# Patient Record
Sex: Male | Born: 2005 | Race: Black or African American | Hispanic: No | Marital: Single | Smoking: Never smoker
Health system: Southern US, Community
[De-identification: ages and names within clinical notes are randomized; demographics above are authoritative.]

## PROBLEM LIST (undated history)

## (undated) HISTORY — PX: ADENOIDECTOMY: SUR15

## (undated) HISTORY — PX: TONSILLECTOMY: SUR1361

---
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
## 2024-01-18 ENCOUNTER — Encounter: Payer: Self-pay | Admitting: Family Medicine

## 2024-05-09 ENCOUNTER — Encounter: Payer: Self-pay | Admitting: Dermatology

## 2024-05-09 ENCOUNTER — Ambulatory Visit: Admitting: Dermatology

## 2024-05-09 DIAGNOSIS — B360 Pityriasis versicolor: Secondary | ICD-10-CM

## 2024-05-09 MED ORDER — KETOCONAZOLE 2 % EX SHAM: MEDICATED_SHAMPOO | CUTANEOUS | 11 refills | Status: AC

## 2024-05-09 NOTE — Patient Instructions (Addendum)
 Stop fluconazole tablet.   Use ketoconazole  shampoo as body wash daily in shower.    Tinea versicolor is a chronic recurrent skin rash causing discolored scaly spots most commonly seen on back, chest, and/or shoulders.  It is generally asymptomatic. The rash is due to overgrowth of a common type of yeast present on everyone's skin and it is not contagious.  It tends to flare more in the summer due to increased sweating on trunk.  After rash is treated, the scaliness will resolve, but the discoloration will take longer to return to normal pigmentation. The periodic use of an OTC medicated soap/shampoo with zinc or selenium sulfide can be helpful to prevent yeast overgrowth and recurrence.    Advised it can take many months for affected areas to repigment.    Due to recent changes in healthcare laws, you may see results of your pathology and/or laboratory studies on MyChart before the doctors have had a chance to review them. We understand that in some cases there may be results that are confusing or concerning to you. Please understand that not all results are received at the same time and often the doctors may need to interpret multiple results in order to provide you with the best plan of care or course of treatment. Therefore, we ask that you please give us  2 business days to thoroughly review all your results before contacting the office for clarification. Should we see a critical lab result, you will be contacted sooner.   If You Need Anything After Your Visit  If you have any questions or concerns for your doctor, please call our main line at [phone] and press option 4 to reach your doctor's medical assistant. If no one answers, please leave a voicemail as directed and we will return your call as soon as possible. Messages left after 4 pm will be answered the following business day.   You may also send us  a message via MyChart. We typically respond to MyChart messages within 1-2 business  days.  For prescription refills, please ask your pharmacy to contact our office. Our fax number is [phone].  If you have an urgent issue when the clinic is closed that cannot wait until the next business day, you can page your doctor at the number below.    Please note that while we do our best to be available for urgent issues outside of office hours, we are not available 24/7.   If you have an urgent issue and are unable to reach us , you may choose to seek medical care at your doctor's office, retail clinic, urgent care center, or emergency room.  If you have a medical emergency, please immediately call 911 or go to the emergency department.  Pager Numbers  - Dr. Hester: [phone]  - Dr. Jackquline: [phone]  - Dr. Claudene: [phone]   - Dr. Raymund: [phone]  In the event of inclement weather, please call our main line at [phone] for an update on the status of any delays or closures.  Dermatology Medication Tips: Please keep the boxes that topical medications come in in order to help keep track of the instructions about where and how to use these. Pharmacies typically print the medication instructions only on the boxes and not directly on the medication tubes.   If your medication is too expensive, please contact our office at [phone] option 4 or send us  a message through MyChart.   We are unable to tell what your co-pay for medications will be  in advance as this is different depending on your insurance coverage. However, we may be able to find a substitute medication at lower cost or fill out paperwork to get insurance to cover a needed medication.   If a prior authorization is required to get your medication covered by your insurance company, please allow us  1-2 business days to complete this process.  Drug prices often vary depending on where the prescription is filled and some pharmacies may offer cheaper prices.  The website www.goodrx.com contains  coupons for medications through different pharmacies. The prices here do not account for what the cost may be with help from insurance (it may be cheaper with your insurance), but the website can give you the price if you did not use any insurance.  - You can print the associated coupon and take it with your prescription to the pharmacy.  - You may also stop by our office during regular business hours and pick up a GoodRx coupon card.  - If you need your prescription sent electronically to a different pharmacy, notify our office through [facility] or by phone at [phone] option 4.     Si Usted Necesita Algo Despus de Su Visita  Tambin puede enviarnos un mensaje a travs de Clinical Cytogeneticist. Por lo general respondemos a los mensajes de MyChart en el transcurso de 1 a 2 das hbiles.  Para renovar recetas, por favor pida a su farmacia que se ponga en contacto con nuestra oficina. Randi lakes de fax es [place] [phone].  Si tiene un asunto urgente cuando la clnica est cerrada y que no puede esperar hasta el siguiente da hbil, puede llamar/localizar a su doctor(a) al nmero que aparece a continuacin.   Por favor, tenga en cuenta que aunque hacemos todo lo posible para estar disponibles para asuntos urgentes fuera del horario de [place], no estamos disponibles las 24 horas del da, los 7 [address]  [phone] de la [place].   Si tiene un problema urgente y no puede comunicarse con nosotros, puede optar por buscar atencin mdica  en el consultorio de su doctor(a), en una clnica privada, en un centro de atencin urgente o en una sala de emergencias.  Si tiene engineer, drilling, por favor llame inmediatamente al 911 o vaya a la sala de emergencias.  Nmeros de bper  - Dr. Hester: [phone]  - Dra. Jackquline: [phone]  - Dr. Claudene: [phone]  - Dra. Kitts: [phone]  En caso de inclemencias del [place], por favor llame a nuestra lnea principal al [phone] para una  actualizacin sobre el estado de cualquier retraso o cierre.  Consejos para la medicacin en dermatologa: Por favor, guarde las cajas en las que vienen los medicamentos de uso tpico para ayudarle a seguir las instrucciones sobre dnde y cmo usarlos. Las farmacias generalmente imprimen las instrucciones del medicamento slo en las cajas y no directamente en los tubos del [place].   Si su medicamento es muy caro, por favor, pngase en contacto con landry rieger llamando al [phone] y presione la opcin 4 o envenos un mensaje a travs de Clinical Cytogeneticist.   No podemos decirle cul ser su copago por los medicamentos por adelantado ya que esto es diferente dependiendo de la cobertura de su seguro. Sin embargo, es posible que podamos encontrar un medicamento sustituto a audiological scientist un formulario para que el seguro cubra el medicamento que se considera necesario.   Si se requiere una autorizacin previa para que su compaa de seguros cubra su medicamento, por  favor permtanos de 1 a 2 das hbiles para completar [address]  Los precios de los medicamentos varan con frecuencia dependiendo del environmental consultant de dnde se surte la receta y alguna farmacias pueden ofrecer precios ms baratos.  El sitio web www.goodrx.com tiene cupones para medicamentos de health and safety inspector. Los precios aqu no tienen en cuenta lo que podra costar con la ayuda del seguro (puede ser ms barato con su seguro), pero el sitio web puede darle el precio si no utiliz tourist information centre manager.  - Puede imprimir el cupn correspondiente y llevarlo con su receta a la farmacia.  - Tambin puede pasar por nuestra oficina durante el horario de atencin regular y education officer, museum una tarjeta de cupones de GoodRx.  - Si necesita que su receta se enve electrnicamente a una farmacia diferente, informe a nuestra oficina a travs de MyChart de [place] o por telfono llamando al [phone] y presione la opcin 4.

## 2024-05-09 NOTE — Progress Notes (Signed)
" ° °  New Patient Visit   Subjective  Wesley Galvan is a 19 y.o. male who presents for the following: discoloration on skin. PCP prescribed Fluconazole 100 mg tablet to take once daily for 30 days. Has not noticed any improvement since starting.  Dur: years. Spreading more recently. Back itches, arms do not itch.  Dx with vitiligo by PCP.    The following portions of the chart were reviewed this encounter and updated as appropriate: medications, allergies, medical history  Review of Systems:  No other skin or systemic complaints except as noted in HPI or Assessment and Plan.  Objective  Well appearing patient in no apparent distress; mood and affect are within normal limits.  A focused examination was performed of the following areas: Face, back, arms  Relevant exam findings are noted in the Assessment and Plan.                     Assessment & Plan   Tinea Versicolor, failed fluconazole  Chronic and persistent condition with duration or expected duration over one year. Condition is bothersome/symptomatic for patient. Currently flared.  KOH positive for hyphae and clusters  Advised it can take many months for affected areas to repigment.  Use ketoconazole  shampoo as body wash daily in shower.    Tinea versicolor is a chronic recurrent skin rash causing discolored scaly spots most commonly seen on back, chest, and/or shoulders.  It is generally asymptomatic. The rash is due to overgrowth of a common type of yeast present on everyone's skin and it is not contagious.  It tends to flare more in the summer due to increased sweating on trunk.  After rash is treated, the scaliness will resolve, but the discoloration will take longer to return to normal pigmentation. The periodic use of an OTC medicated soap/shampoo with zinc or selenium sulfide can be helpful to prevent yeast overgrowth and recurrence.     Return in about 2 months (around 07/07/2024) for Tinea  Versicolor Follow up.  I, Jill Parcell, CMA, am acting as scribe for Boneta Sharps, MD.   Documentation: I have reviewed the above documentation for accuracy and completeness, and I agree with the above.  Boneta Sharps, MD    "

## 2024-05-09 NOTE — Progress Notes (Deleted)
.  noteg

## 2024-05-11 ENCOUNTER — Telehealth: Payer: Self-pay

## 2024-05-11 NOTE — Telephone Encounter (Signed)
 Called patient's mother they have been printed and ready for pick up, she stated that the patient has already came up here to get his immunizations.  However there was no documentation pertaining to this.

## 2024-05-11 NOTE — Telephone Encounter (Signed)
 Copied from CRM [id number]. Topic: General - Other >> May 11, 2024 10:03 AM Myrick T wrote: Reason for CRM: mom called to get a copy of patients immunization records. Please f/u with patients mom when records are ready for pick up.

## 2024-06-28 ENCOUNTER — Ambulatory Visit: Admitting: Dermatology
